# Patient Record
Sex: Female | Born: 1985 | Race: White | Hispanic: No | Marital: Married | Smoking: Never smoker
Health system: Southern US, Community
[De-identification: ages and names within clinical notes are randomized; demographics above are authoritative.]

## PROBLEM LIST (undated history)

## (undated) DIAGNOSIS — E669 Obesity, unspecified: Secondary | ICD-10-CM

## (undated) DIAGNOSIS — M549 Dorsalgia, unspecified: Secondary | ICD-10-CM

---

## 2005-04-28 HISTORY — PX: CHOLECYSTECTOMY: SHX55

## 2006-06-22 ENCOUNTER — Other Ambulatory Visit: Admission: RE | Admit: 2006-06-22 | Discharge: 2006-06-22 | Payer: Self-pay | Admitting: Obstetrics & Gynecology

## 2007-06-30 ENCOUNTER — Emergency Department (HOSPITAL_COMMUNITY): Admission: EM | Admit: 2007-06-30 | Discharge: 2007-06-30 | Payer: Self-pay | Admitting: Emergency Medicine

## 2008-06-16 ENCOUNTER — Ambulatory Visit (HOSPITAL_COMMUNITY): Admission: RE | Admit: 2008-06-16 | Discharge: 2008-06-16 | Payer: Self-pay | Admitting: Obstetrics and Gynecology

## 2008-09-29 ENCOUNTER — Inpatient Hospital Stay (HOSPITAL_COMMUNITY): Admission: AD | Admit: 2008-09-29 | Discharge: 2008-10-02 | Payer: Self-pay | Admitting: Obstetrics and Gynecology

## 2010-08-05 LAB — CBC
Hemoglobin: 11.6 g/dL — ABNORMAL LOW (ref 12.0–15.0)
Hemoglobin: 8.7 g/dL — ABNORMAL LOW (ref 12.0–15.0)
MCHC: 34.5 g/dL (ref 30.0–36.0)
RBC: 3.84 MIL/uL — ABNORMAL LOW (ref 3.87–5.11)
RDW: 14.1 % (ref 11.5–15.5)
RDW: 14.1 % (ref 11.5–15.5)

## 2010-08-05 LAB — TYPE AND SCREEN
ABO/RH(D): O POS
Antibody Screen: NEGATIVE

## 2010-08-05 LAB — APTT: aPTT: 30 seconds (ref 24–37)

## 2010-08-05 LAB — ABO/RH: ABO/RH(D): O POS

## 2010-08-05 LAB — PROTIME-INR: INR: 1 (ref 0.00–1.49)

## 2010-09-10 NOTE — H&P (Signed)
NAME:  Rebecca Lambert, BELLIN NO.:  [id number]   MEDICAL RECORD NO.:  [id number]          PATIENT TYPE:  INP   LOCATION:  NA                            FACILITY:  [facility]   PHYSICIAN:  Kendra H. Tenny Craw, MD     DATE OF BIRTH:  07-15-1985   DATE OF ADMISSION:  DATE OF DISCHARGE:                              HISTORY & PHYSICAL   CHIEF COMPLAINT:  A 39-week intrauterine pregnancy complete-complete  breech presentation.   HISTORY OF PRESENT ILLNESS:  Rebecca Lambert is a 25 year old gravida 1,  para 0 with an estimated date of confinement of October 03, 2008 by last  menstrual period of December 28, 2007 who presents on September 29, 2008 at 39  weeks and 4 days for a scheduled primary cesarean section for complete  breech presentation.  An ultrasound was performed at 37 weeks for size  greater than dates and demonstrated an estimated fetal weight of 2877 g,  which was 44th percentile.  However, at that time the fetus was noted to  be a complete breech presentation.  She was given the option of external  cephalic version versus scheduled primary C-section.  She declined  version and elected to go with primary C-section.  Risks, benefits, and  alternatives of the procedure were discussed with the patient.  She  presents today on September 29, 2008 for a scheduled primary low transverse  cesarean section.   PAST MEDICAL HISTORY:  1. Irritable bowel syndrome.  2. Gastroesophageal reflux disease.  3. Obesity, BMI 38   PAST SURGICAL HISTORY:  Cholecystectomy in 2007.   PAST OBSTETRICAL HISTORY:  G1, P0, current pregnancy.   PAST GYNECOLOGIC HISTORY:  No abnormal Pap smears or sexually  transmitted diseases.   SOCIAL HISTORY:  Negative x3.   MEDICATIONS:  Prenatal vitamins.   ALLERGIES:  SULFA causes a rash.   CURRENT PRENATAL LABORATORY DATA:  Blood type O+, antibody negative, RPR  nonreactive, rubella immune, hepatitis B surface antigen negative, and  HIV nonreactive.  First trimester  screen, no increased risk.  Second  trimester maternal serum AFP, no increased risk, 1-hour Glucola 111.  GBS negative.  Gonorrhea and Chlamydia were negative.  Pap smear was  within normal limits.  Urine culture was negative.  Cystic fibrosis  screen was negative.   PHYSICAL EXAMINATION:  The patient is 74 inches tall and weighs 339  pounds.  Blood pressure is 118/64.  She has 1+ edema. She is alert and  oriented x3.  Abdomen is gravid, soft, and nontender.  Cervix is  deferred, but previously had been noted to be fingertip thick and high.   ASSESSMENT AND PLAN:  This is a 25 year old G1, P0 at 39 weeks and 4  days who presents for a primary scheduled cesarean section for breech.      Freddrick March. Tenny Craw, MD  Electronically Signed     KHR/MEDQ  D:  09/26/2008  T:  09/27/2008  Job:  [id number]

## 2010-09-10 NOTE — Discharge Summary (Signed)
NAMEDEJANAE, Rebecca Lambert NO.:  [id number]   MEDICAL RECORD NO.:  [id number]          PATIENT TYPE:  INP   LOCATION:  9126                          FACILITY:  [facility]   PHYSICIAN:  Gerrit Friends. Aldona Bar, M.D.   DATE OF BIRTH:  11-05-1985   DATE OF ADMISSION:  09/29/2008  DATE OF DISCHARGE:  10/02/2008                               DISCHARGE SUMMARY   DISCHARGE DIAGNOSES:  1. A 39-week intrauterine pregnancy, delivered, 8 pounds 12 ounces      female infant, Apgars of 9 and 9.  2. Blood type O positive.  3. Double footling breech presentation.   PROCEDURE:  Primary low-transverse cesarean section.   SUMMARY:  This is a 25 year old female, a primigravida with a due date  of October 03, 2008, was taken to the operating room on September 29, 2008, by Dr.  Tenny Craw for a delivery by cesarean section with a known breech  presentation.   She was delivered of an 8-pound 12-ounce female infant - double footling  breech presentation - an Apgars of 9 and 9.   She had a benign postoperative course and on the morning of October 02, 2008, was ambulating without difficulty, tolerating a regular diet  without difficulty, having normal bowel and bladder function.  Her vital  signs were stable.  Her wound was clean and dry and she was desirous of  discharge.  Accordingly, she was discharged to home with appropriate  instructions.  Her staples were removed and wound was Steri-Stripped on  the morning of discharge.   DISCHARGE MEDICATIONS:  1. Motrin 600 mg every 6 hours as needed for cramping or pain.  2. Tylox 1-2 every 4-6 hours as needed for more severe pain.  3. She will return to the office for followup in approximately 4      weeks' time.  She was also instructed to continue on her vitamins 1      a day and add iron - 1 daily per instructions.   CONDITION ON DISCHARGE:  Improved.      Gerrit Friends. Aldona Bar, M.D.  Electronically Signed     RMW/MEDQ  D:  10/02/2008  T:  10/02/2008  Job:  [id number]

## 2010-09-10 NOTE — Op Note (Signed)
NAME:  [name], [name]:  [id number]   MEDICAL RECORD NO.:  [id number]          PATIENT TYPE:  INP   LOCATION:  9126                          FACILITY:  [facility]   PHYSICIAN:  Kendra H. Tenny Craw, MD     DATE OF BIRTH:  17-Nov-1985   DATE OF PROCEDURE:  09/29/2008  DATE OF DISCHARGE:                               OPERATIVE REPORT   PREOPERATIVE DIAGNOSES:  1. A 39 and 4-week intrauterine pregnancy.  2. Double footling breech presentation, declined external cephalic      version.  3. Maternal obesity with a BMI of 44.7.   POSTOPERATIVE DIAGNOSES:  1. A 39 and 4-week intrauterine pregnancy.  2. Double footling breech presentation, declined external cephalic      version.  3. Maternal obesity with a BMI of 44.7.   PROCEDURE:  Primary low transverse cesarean section via Pfannenstiel's  skin incision.   SURGEON:  Freddrick March. Tenny Craw, MD   ASSISTANT:  Miguel Aschoff, MD   ANESTHESIA:  Spinal.   OPERATIVE FINDINGS:  Vigorous female infant in the double footling breech  presentation weighing 8 pounds 12 ounces with Apgar scores of 9 at 1  minute and 9 at 5 minutes.  Normal-appearing ovaries and tubes,  questionable arcuate shape to the uterus.   SPECIMEN:  Placenta.  Disposition of specimen for disposal.   ESTIMATED BLOOD LOSS:  700 mL.   COMPLICATIONS:  Difficult placement of spinal anesthesia; otherwise,  procedure was without complication.   PROCEDURE IN DETAIL:  Rebecca Lambert is a 25 year old G1, P0, who  presents at 64 weeks and 4 days estimated gestational age with a fetus  in the double footling breech presentation.  The patient had an  ultrasound approximately 2 weeks ago for size greater than dates.  Estimated fetal weight was 44th percentile.  However, the infant was  noted to be in the double footling breech presentation.  The patient was  counseled regarding primary cesarean section versus external cephalic  version and she declined version, and wished to  proceed with primary  cesarean section.  Following the appropriate informed consent, the  patient was brought to the operating room where spinal anesthesia was  placed with some difficulty.  However, it was successfully placed and  found to be adequate.  She was placed in the dorsal supine position with  a leftward tilt, prepped and draped in the normal sterile fashion.  A  scalpel was then used to make a Pfannenstiel skin incision which was  carried down through the underlying layers of soft tissue to the fascia.  Fascia was incised in the midline.  Fascial incision was extended  laterally with Mayo scissors.  Superior aspect of the fascial incision  was grasped with Kocher clamps x2, tented up, underlying rectus muscle  was dissected sharply off of the electrocautery unit.  The same  procedure was repeated on the inferior aspect of the fascial incision.  Rectus muscles were then separated in the midline.  The abdominal  peritoneum was identified, tented up, entered sharply with the  Metzenbaum scissors and incision was extended  superiorly and inferiorly  with good visualization of the bladder with blunt dissection.  The  Alexis retractor was then placed intra-abdominally and deployed.  The  vesicouterine peritoneum was then identified, tented up, entered sharply  with the Metzenbaum.  The incision was extended laterally with the  Metzenbaum and the bladder flap was created with blunt dissection.  A  scalpel was then used to make a low transverse incision on the uterus  which was extended laterally with blunt dissection.  The bandage  scissors were used to extend the left aspect of the uterine incision.  The two feet were then identified and grasped and delivered through the  uterine incision followed by the body up to the level of the scapula  bilaterally.  The arms were swooped across the chest, the head was  flexed and delivered without difficulty.  The infant cried vigorously on  the  operative field.  After bulb suctioning, cord was clamped and cut.  Infant was then passed to the awaiting pediatricians.  Placenta was  spontaneously delivered.  Uterus was cleared of all clot and debris.  Uterine incision was repaired with #1 chromic in a running locked  fashion with a second imbricating layer.  The uterus was noted to be  somewhat arcuate in appearance.  The ovaries and tubes were normal.  The  uterus was then returned to the abdominal cavity.  Uterine incision was  hemostatic.  Uterine cavity was cleared of all clot and debris.  The  Alexis retractor was disassembled.  The abdominal peritoneum was  reapproximated with 2-0 Vicryl.  The rectus muscles were reapproximated  with 2-0 Vicryl.  The fascia was reapproximated with 0 looped PDS.  The  skin was reapproximated with 2-0 plain gut interrupted sutures.  The  skin was closed with staples.  All sponge, lap, and needle counts were  correct x2.  The patient tolerated the procedure well and was brought to  the recovery room in stable condition.      Freddrick March. Tenny Craw, MD  Electronically Signed     KHR/MEDQ  D:  09/29/2008  T:  09/30/2008  Job:  [id number]

## 2010-10-23 ENCOUNTER — Emergency Department (HOSPITAL_BASED_OUTPATIENT_CLINIC_OR_DEPARTMENT_OTHER)
Admission: EM | Admit: 2010-10-23 | Discharge: 2010-10-23 | Disposition: A | Discharge status: Home / Self Care | Payer: Managed Care, Other (non HMO) | Attending: Emergency Medicine | Admitting: Emergency Medicine

## 2010-10-23 DIAGNOSIS — M545 Low back pain, unspecified: Secondary | ICD-10-CM | POA: Insufficient documentation

## 2010-10-23 LAB — URINALYSIS, ROUTINE W REFLEX MICROSCOPIC
Glucose, UA: NEGATIVE mg/dL
Ketones, ur: NEGATIVE mg/dL
Leukocytes, UA: NEGATIVE
Protein, ur: NEGATIVE mg/dL

## 2014-09-24 ENCOUNTER — Encounter (HOSPITAL_BASED_OUTPATIENT_CLINIC_OR_DEPARTMENT_OTHER): Payer: Self-pay | Admitting: Emergency Medicine

## 2014-09-24 ENCOUNTER — Emergency Department (HOSPITAL_BASED_OUTPATIENT_CLINIC_OR_DEPARTMENT_OTHER)
Admission: EM | Admit: 2014-09-24 | Discharge: 2014-09-25 | Disposition: A | Discharge status: Home / Self Care | Payer: No Typology Code available for payment source | Attending: Emergency Medicine | Admitting: Emergency Medicine

## 2014-09-24 DIAGNOSIS — R110 Nausea: Secondary | ICD-10-CM | POA: Diagnosis not present

## 2014-09-24 DIAGNOSIS — Z9049 Acquired absence of other specified parts of digestive tract: Secondary | ICD-10-CM | POA: Insufficient documentation

## 2014-09-24 DIAGNOSIS — K5904 Chronic idiopathic constipation: Secondary | ICD-10-CM

## 2014-09-24 DIAGNOSIS — Z3202 Encounter for pregnancy test, result negative: Secondary | ICD-10-CM | POA: Insufficient documentation

## 2014-09-24 DIAGNOSIS — Z9889 Other specified postprocedural states: Secondary | ICD-10-CM | POA: Insufficient documentation

## 2014-09-24 DIAGNOSIS — R1084 Generalized abdominal pain: Secondary | ICD-10-CM | POA: Diagnosis present

## 2014-09-24 DIAGNOSIS — R109 Unspecified abdominal pain: Secondary | ICD-10-CM

## 2014-09-24 DIAGNOSIS — K5900 Constipation, unspecified: Secondary | ICD-10-CM | POA: Diagnosis not present

## 2014-09-24 MED ORDER — ONDANSETRON HCL 4 MG/2ML IJ SOLN
4.0000 mg | Freq: Once | INTRAMUSCULAR | Status: AC
  Administered 2014-09-25: 4 mg via INTRAVENOUS
  Filled 2014-09-24: qty 2

## 2014-09-24 NOTE — ED Notes (Signed)
Patient c/o generalized abd pain, onset Friday. Patient states she was on antibiotics for strep throat, penicillin & also orapred, stopped taking it on Friday due to GI abd discomfort. C/O nausea, denies vomiting, denies diarrhea, last BM 09/23/2014. Denies sick contact.

## 2014-09-25 ENCOUNTER — Encounter (HOSPITAL_BASED_OUTPATIENT_CLINIC_OR_DEPARTMENT_OTHER): Payer: Self-pay | Admitting: Emergency Medicine

## 2014-09-25 ENCOUNTER — Emergency Department (HOSPITAL_BASED_OUTPATIENT_CLINIC_OR_DEPARTMENT_OTHER): Payer: No Typology Code available for payment source

## 2014-09-25 LAB — CBC WITH DIFFERENTIAL/PLATELET
Basophils Absolute: 0 10*3/uL (ref 0.0–0.1)
Basophils Relative: 0 % (ref 0–1)
EOS ABS: 0.1 10*3/uL (ref 0.0–0.7)
EOS PCT: 1 % (ref 0–5)
HEMATOCRIT: 36.6 % (ref 36.0–46.0)
HEMOGLOBIN: 11.6 g/dL — AB (ref 12.0–15.0)
LYMPHS ABS: 3.4 10*3/uL (ref 0.7–4.0)
LYMPHS PCT: 29 % (ref 12–46)
MCH: 27.4 pg (ref 26.0–34.0)
MCHC: 31.7 g/dL (ref 30.0–36.0)
MCV: 86.3 fL (ref 78.0–100.0)
MONO ABS: 0.9 10*3/uL (ref 0.1–1.0)
Monocytes Relative: 8 % (ref 3–12)
NEUTROS PCT: 62 % (ref 43–77)
Neutro Abs: 7.3 10*3/uL (ref 1.7–7.7)
PLATELETS: 315 10*3/uL (ref 150–400)
RBC: 4.24 MIL/uL (ref 3.87–5.11)
RDW: 13.8 % (ref 11.5–15.5)
WBC: 11.8 10*3/uL — AB (ref 4.0–10.5)

## 2014-09-25 LAB — URINALYSIS, ROUTINE W REFLEX MICROSCOPIC
BILIRUBIN URINE: NEGATIVE
Glucose, UA: NEGATIVE mg/dL
HGB URINE DIPSTICK: NEGATIVE
Ketones, ur: NEGATIVE mg/dL
Leukocytes, UA: NEGATIVE
Nitrite: NEGATIVE
Protein, ur: NEGATIVE mg/dL
Specific Gravity, Urine: 1.01 (ref 1.005–1.030)
UROBILINOGEN UA: 0.2 mg/dL (ref 0.0–1.0)
pH: 6 (ref 5.0–8.0)

## 2014-09-25 LAB — COMPREHENSIVE METABOLIC PANEL
ALBUMIN: 3.6 g/dL (ref 3.5–5.0)
ALT: 26 U/L (ref 14–54)
ANION GAP: 8 (ref 5–15)
AST: 14 U/L — ABNORMAL LOW (ref 15–41)
Alkaline Phosphatase: 81 U/L (ref 38–126)
BUN: 13 mg/dL (ref 6–20)
CO2: 28 mmol/L (ref 22–32)
Calcium: 9.3 mg/dL (ref 8.9–10.3)
Chloride: 105 mmol/L (ref 101–111)
Creatinine, Ser: 0.73 mg/dL (ref 0.44–1.00)
GFR calc non Af Amer: >60 mL/min (ref >60)
Glucose, Bld: 107 mg/dL — ABNORMAL HIGH (ref 65–99)
POTASSIUM: 3.8 mmol/L (ref 3.5–5.1)
Sodium: 141 mmol/L (ref 135–145)
TOTAL PROTEIN: 7.6 g/dL (ref 6.5–8.1)
Total Bilirubin: 0.5 mg/dL (ref 0.3–1.2)

## 2014-09-25 LAB — PREGNANCY, URINE: PREG TEST UR: NEGATIVE

## 2014-09-25 LAB — LIPASE, BLOOD: LIPASE: 30 U/L (ref 22–51)

## 2014-09-25 MED ORDER — KETOROLAC TROMETHAMINE 30 MG/ML IJ SOLN
30.0000 mg | Freq: Once | INTRAMUSCULAR | Status: AC
  Administered 2014-09-25: 30 mg via INTRAVENOUS
  Filled 2014-09-25: qty 1

## 2014-09-25 MED ORDER — DOCUSATE SODIUM 100 MG PO CAPS: 100.0000 mg | ORAL_CAPSULE | Freq: Two times a day (BID) | ORAL | Status: AC

## 2014-09-25 MED ORDER — GI COCKTAIL ~~LOC~~
30.0000 mL | Freq: Once | ORAL | Status: AC
  Administered 2014-09-25: 30 mL via ORAL
  Filled 2014-09-25: qty 30

## 2014-09-25 MED ORDER — POLYETHYLENE GLYCOL 3350 17 GM/SCOOP PO POWD: 17.0000 g | Freq: Every day | ORAL | Status: AC

## 2014-09-25 MED ORDER — DICYCLOMINE HCL 10 MG/ML IM SOLN
20.0000 mg | Freq: Once | INTRAMUSCULAR | Status: AC
  Administered 2014-09-25: 20 mg via INTRAMUSCULAR
  Filled 2014-09-25: qty 2

## 2014-09-25 MED ORDER — CULTURELLE DIGESTIVE HEALTH PO CAPS: 1.0000 | ORAL_CAPSULE | Freq: Three times a day (TID) | ORAL | Status: AC

## 2014-09-25 MED ORDER — HYOSCYAMINE SULFATE 0.125 MG SL SUBL: 0.1250 mg | SUBLINGUAL_TABLET | SUBLINGUAL | Status: AC | PRN

## 2014-09-25 NOTE — ED Provider Notes (Signed)
CSN: [id number]     Arrival date & time 09/24/14  2323 History  This chart was scribed for Hampton Wixom, MD by Ronney LionSuzanne Le, ED Scribe. This patient was seen in room MH04/MH04 and the patient's care was started at 12:37 AM.    Chief Complaint  Patient presents with  . Abdominal Pain    generalized   Patient is a 29 y.o. female presenting with abdominal pain. The history is provided by the patient. No language interpreter was used.  Abdominal Pain Pain location:  Generalized Pain quality: squeezing   Pain radiates to:  Does not radiate Pain severity:  Moderate Onset quality:  Gradual Duration:  2 days Timing:  Constant Progression:  Unchanged Chronicity:  New Context: recent illness (strep)   Relieved by:  None tried Worsened by:  Nothing tried Ineffective treatments:  None tried Associated symptoms: constipation and nausea   Associated symptoms: no belching, no chest pain, no cough, no dysuria, no fever, no flatus and no vomiting   Risk factors: no alcohol abuse      HPI Comments: Rebecca Lambert is a 29 y.o. female who presents to the Emergency Department complaining of constant, generalized, "squeezing" abdominal pain that has been ongoing since 2-3 days ago. Patient reports 1 episode of diarrhea and nausea, and endorses feeling bloated. Her last normal BM was over a week ago. Patient was diagnosed with strep throat 1 week ago, and was given 5 days of penicillin and steroids orally, which she just finished. Nothing makes her symptoms better of worse. Patient endorses feeling bloated. She reports a history of cholecystectomy and C section in 2010.  She denies vomiting, dysuria, urine malodor, or any more belching or flatus than usual.    History reviewed. No pertinent past medical history. Past Surgical History  Procedure Laterality Date  . Cholecystectomy  2007  . Cesarean section  2010   No family history on file. History  Substance Use Topics  . Smoking status: Never Smoker    . Smokeless tobacco: Not on file  . Alcohol Use: No   OB History    No data available     Review of Systems  Constitutional: Negative for fever and appetite change.  Respiratory: Negative for cough.   Cardiovascular: Negative for chest pain.  Gastrointestinal: Positive for nausea, abdominal pain and constipation. Negative for vomiting and flatus.  Genitourinary: Negative for dysuria.  All other systems reviewed and are negative.   Allergies  Review of patient's allergies indicates no known allergies.  Home Medications   Prior to Admission medications   Not on File   BP 146/87 mmHg  Pulse 84  Temp(Src) 98.5 F (36.9 C) (Oral)  Resp 18  Ht 6' (1.829 m)  Wt 339 lb (153.769 kg)  BMI 45.97 kg/m2  SpO2 99%  LMP 09/15/2014 (Exact Date) Physical Exam  Constitutional: She is oriented to person, place, and time. She appears well-developed and well-nourished. No distress.  HENT:  Head: Normocephalic and atraumatic.  Mouth/Throat: Oropharynx is clear and moist. No oropharyngeal exudate.  Eyes: EOM are normal. Pupils are equal, round, and reactive to light.  Neck: Normal range of motion. Neck supple.  Cardiovascular: Normal rate, regular rhythm and normal heart sounds.   Pulmonary/Chest: Effort normal and breath sounds normal. No respiratory distress. She has no wheezes. She has no rales.  Abdominal: Soft. Bowel sounds are increased. There is no tenderness. There is no rigidity, no rebound, no guarding, no tenderness at McBurney's point and negative Murphy's  sign. No hernia.  Hyperactive bowel sounds on the left and right. Palpable stool  Musculoskeletal: Normal range of motion.  Neurological: She is alert and oriented to person, place, and time. She has normal reflexes. She displays normal reflexes. No cranial nerve deficit.  Skin: Skin is warm and dry.  Psychiatric: She has a normal mood and affect.  Nursing note and vitals reviewed.   ED Course  Procedures (including  critical care time)  DIAGNOSTIC STUDIES: Oxygen Saturation is 99% on RA, normal by my interpretation.    COORDINATION OF CARE: 12:40 AM - Discussed treatment plan with pt at bedside which includes XR, and pt agreed to plan.   Labs Review Labs Reviewed  CBC WITH DIFFERENTIAL/PLATELET - Abnormal; Notable for the following:    WBC 11.8 (*)    Hemoglobin 11.6 (*)    All other components within normal limits  PREGNANCY, URINE  URINALYSIS, ROUTINE W REFLEX MICROSCOPIC (NOT AT [facility])  COMPREHENSIVE METABOLIC PANEL  LIPASE, BLOOD    Imaging Review No results found.   EKG Interpretation None      MDM   Final diagnoses:  None   Exam and vitals are benign and reassuring.  White count 11 but she just complete a course of steroids and I suspect this is demargination. She states she had one episode of diarrhea a week ago and nothing since.   Feels like she needs to go but hasn't.  Will start patient on a bowel regimen and give strict return precautions.    I personally performed the services described in this documentation, which was scribed in my presence. The recorded information has been reviewed and is accurate.     Cy Blamer, MD 09/25/14 [phone]

## 2014-09-25 NOTE — ED Notes (Signed)
C/o diffuse abd pain, nausea and diarrhea, (denies: vomiting, lower abd pain, dysuria, bleeding, fever, back pain, sob, indigestion, dizziness, d/c or other sx), cholecystectomy in 2006, stopped abx early on day 5 of 7, last ate 1600 (tolerated), mentions watery yellow stool x1 today.

## 2017-01-22 IMAGING — DX DG ABDOMEN ACUTE W/ 1V CHEST
4 series · 4 of 4 positions shown · non-contrast
Comparison: Lumbar spine radiographs performed 06/30/2007

CLINICAL DATA: Acute onset of constant abdominal pain. Initial
encounter.

EXAM:
DG ABDOMEN ACUTE W/ 1V CHEST

[chest pa]
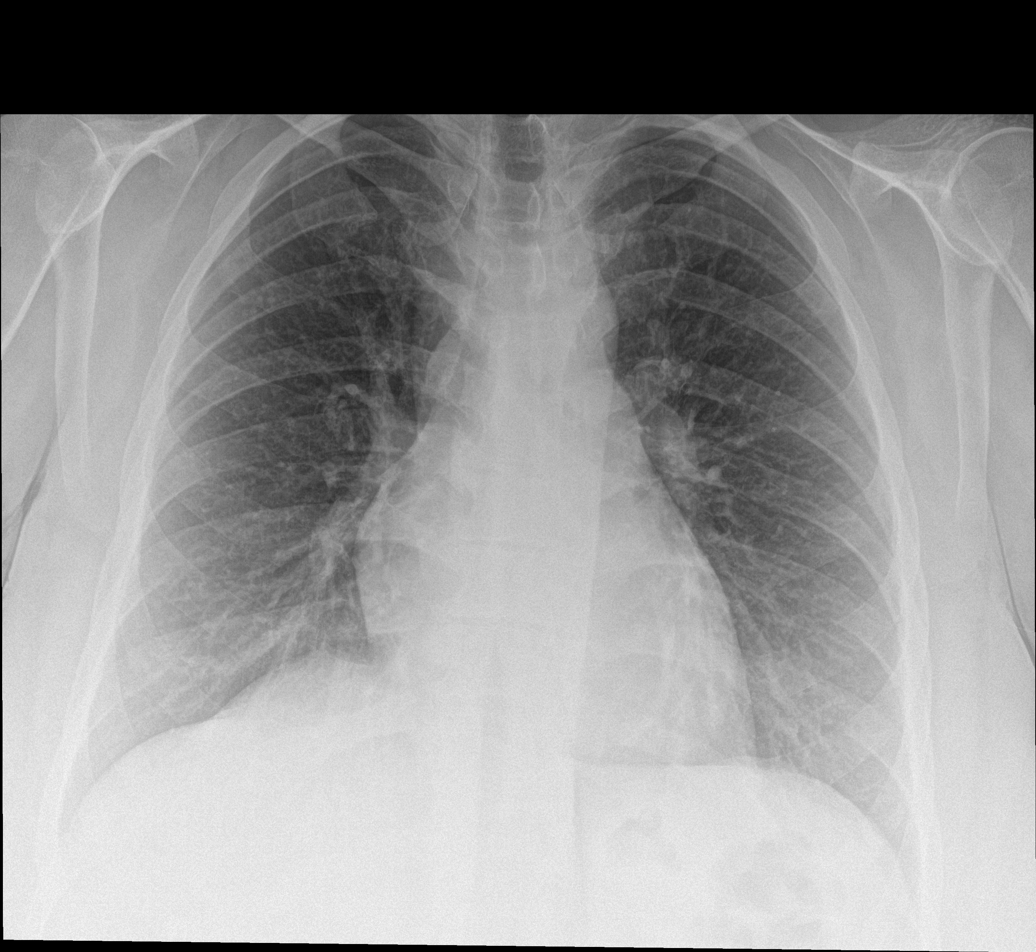

[abdomen erect]
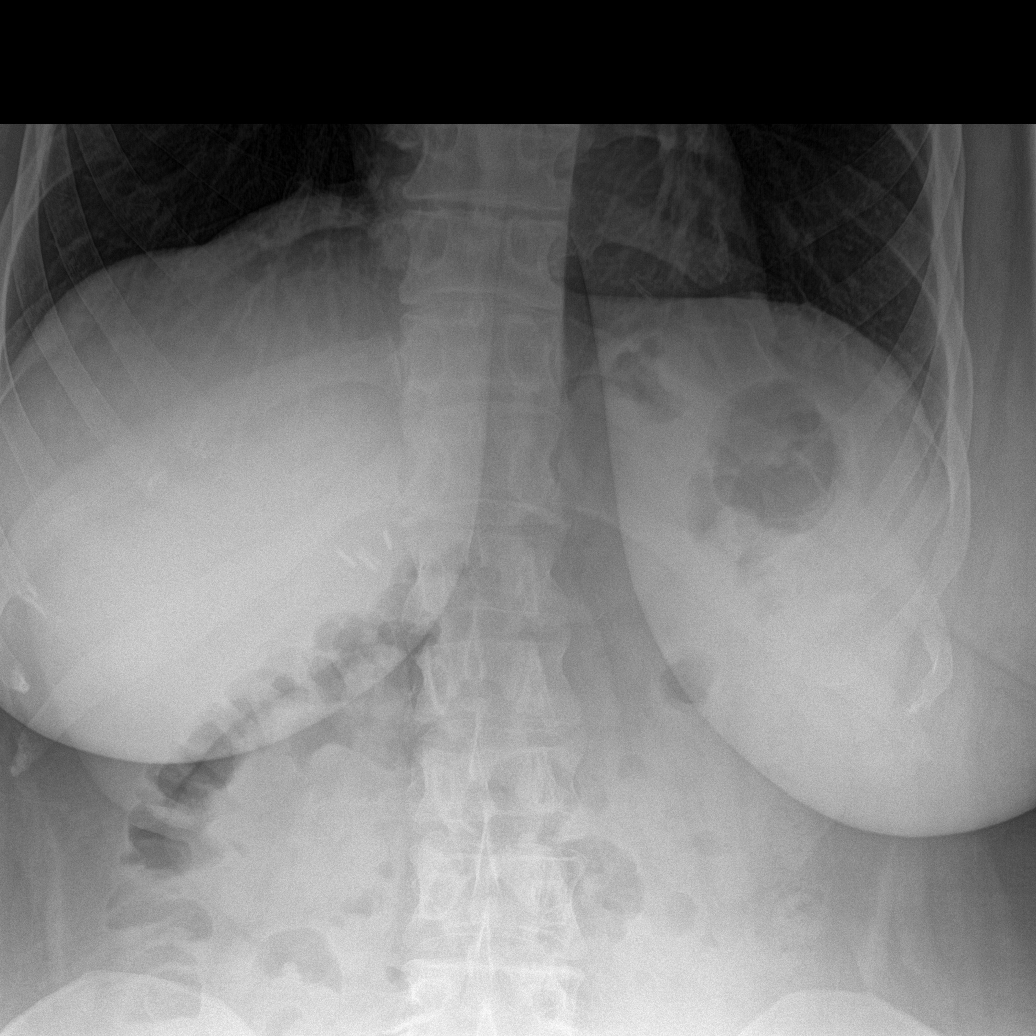

[abdomen supine (1 of 2)]
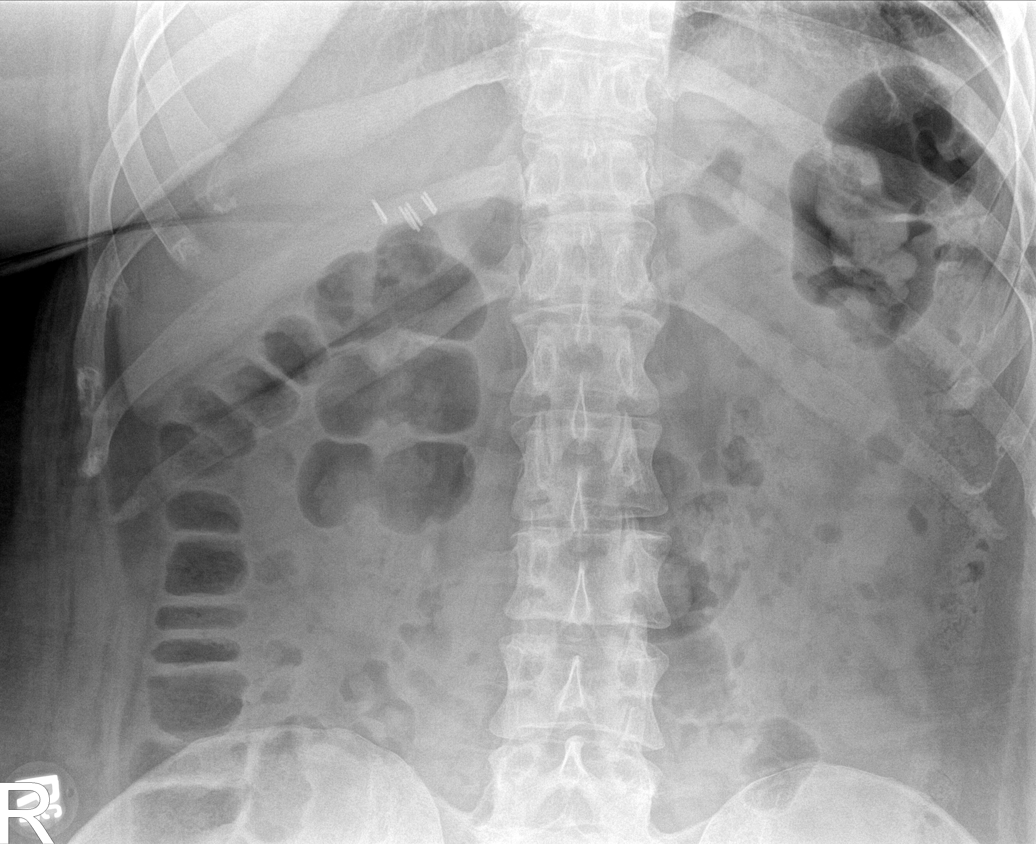

[abdomen supine (2 of 2)]
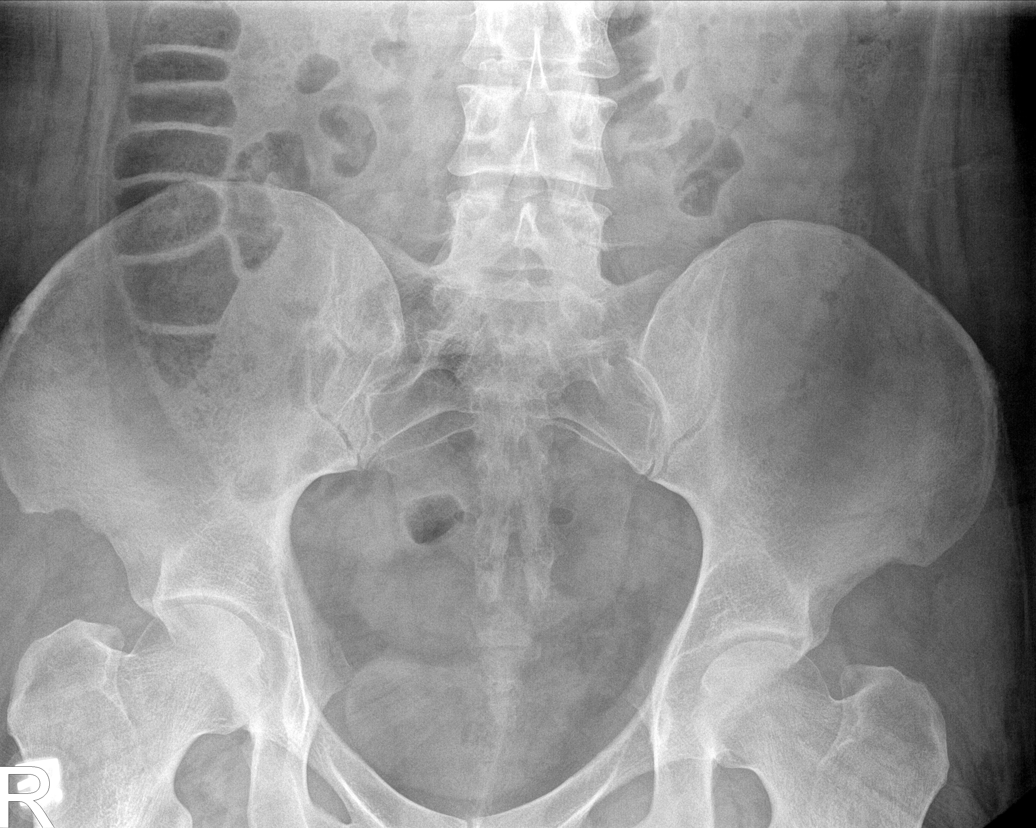

[4 of 4 positions shown; findings below may reference images not displayed]

FINDINGS: The lungs are well-aerated. Mild vascular congestion is noted. There
is no evidence of focal opacification, pleural effusion or
pneumothorax. The cardiomediastinal silhouette is within normal
limits.

The visualized bowel gas pattern is unremarkable. Scattered stool
and air are seen within the colon; there is no evidence of small
bowel dilatation to suggest obstruction. No free intra-abdominal air
is identified on the provided upright view. Clips are noted within
the right upper quadrant, reflecting prior cholecystectomy.

No acute osseous abnormalities are seen; the sacroiliac joints are
unremarkable in appearance.
IMPRESSION: 1. Unremarkable bowel gas pattern; no free intra-abdominal air seen.
Small to moderate amount of stool noted in the colon.
2. Mild vascular congestion; lungs remain grossly clear.

## 2017-05-15 ENCOUNTER — Emergency Department (HOSPITAL_BASED_OUTPATIENT_CLINIC_OR_DEPARTMENT_OTHER)
Admission: EM | Admit: 2017-05-15 | Discharge: 2017-05-15 | Disposition: A | Discharge status: Home / Self Care | Payer: No Typology Code available for payment source | Attending: Emergency Medicine | Admitting: Emergency Medicine

## 2017-05-15 ENCOUNTER — Other Ambulatory Visit: Payer: Self-pay

## 2017-05-15 ENCOUNTER — Encounter (HOSPITAL_BASED_OUTPATIENT_CLINIC_OR_DEPARTMENT_OTHER): Payer: Self-pay | Admitting: Emergency Medicine

## 2017-05-15 DIAGNOSIS — Z79899 Other long term (current) drug therapy: Secondary | ICD-10-CM | POA: Insufficient documentation

## 2017-05-15 DIAGNOSIS — M545 Low back pain: Secondary | ICD-10-CM | POA: Diagnosis present

## 2017-05-15 DIAGNOSIS — M5442 Lumbago with sciatica, left side: Secondary | ICD-10-CM | POA: Diagnosis not present

## 2017-05-15 HISTORY — DX: Dorsalgia, unspecified: M54.9

## 2017-05-15 HISTORY — DX: Obesity, unspecified: E66.9

## 2017-05-15 LAB — URINALYSIS, ROUTINE W REFLEX MICROSCOPIC
Bilirubin Urine: NEGATIVE
GLUCOSE, UA: NEGATIVE mg/dL
Ketones, ur: 15 mg/dL — AB
Leukocytes, UA: NEGATIVE
Nitrite: NEGATIVE
Protein, ur: NEGATIVE mg/dL
pH: 6 (ref 5.0–8.0)

## 2017-05-15 LAB — URINALYSIS, MICROSCOPIC (REFLEX)

## 2017-05-15 LAB — PREGNANCY, URINE: Preg Test, Ur: NEGATIVE

## 2017-05-15 MED ORDER — METHOCARBAMOL 500 MG PO TABS: 500.0000 mg | ORAL_TABLET | Freq: Three times a day (TID) | ORAL | 0 refills | Status: AC | PRN

## 2017-05-15 MED ORDER — HYDROCODONE-ACETAMINOPHEN 5-325 MG PO TABS
2.0000 | ORAL_TABLET | Freq: Once | ORAL | Status: AC
  Administered 2017-05-15: 2 via ORAL
  Filled 2017-05-15: qty 2

## 2017-05-15 MED ORDER — IBUPROFEN 600 MG PO TABS: 600.0000 mg | ORAL_TABLET | Freq: Three times a day (TID) | ORAL | 0 refills | Status: AC | PRN

## 2017-05-15 MED ORDER — CYCLOBENZAPRINE HCL 5 MG PO TABS
5.0000 mg | ORAL_TABLET | Freq: Once | ORAL | Status: AC
Start: 2017-05-15 — End: 2017-05-15
  Administered 2017-05-15: 5 mg via ORAL
  Filled 2017-05-15: qty 1

## 2017-05-15 MED ORDER — PREDNISONE 20 MG PO TABS: 40.0000 mg | ORAL_TABLET | Freq: Every day | ORAL | 0 refills | Status: AC

## 2017-05-15 MED ORDER — IBUPROFEN 400 MG PO TABS
600.0000 mg | ORAL_TABLET | Freq: Once | ORAL | Status: AC
  Administered 2017-05-15: 600 mg via ORAL
  Filled 2017-05-15: qty 1

## 2017-05-15 NOTE — ED Provider Notes (Signed)
[facility] EMERGENCY DEPARTMENT Provider Note   CSN: [id number] Arrival date & time: 05/15/17  0731     History   Chief Complaint Chief Complaint  Patient presents with  . Back Pain    HPI Rebecca Lambert is a 32 y.o. female.  HPI Patient is a 32 year old female presents the emergency department with left low back pain with radiation down her left buttock towards her left leg.  She had an episode like this before.  She states that her pain yesterday caused her to fall.  She denies weakness in her left lower extremity.  No recent injury or trauma otherwise prior to the development of her back pain.  No fevers or chills.  She has had episodes like this before.  She was told before it was a "muscle spasm".  Symptoms are moderate in severity.  No improvement with over-the-counter medications   Past Medical History:  Diagnosis Date  . Back pain   . Obesity     There are no active problems to display for this patient.   Past Surgical History:  Procedure Laterality Date  . CESAREAN SECTION  2010  . CHOLECYSTECTOMY  2007    OB History    No data available       Home Medications    Prior to Admission medications   Medication Sig Start Date End Date Taking? Authorizing Provider  docusate sodium (COLACE) 100 MG capsule Take 1 capsule (100 mg total) by mouth every 12 (twelve) hours. 09/25/14   Palumbo, April, MD  hyoscyamine (LEVSIN/SL) 0.125 MG SL tablet Place 1 tablet (0.125 mg total) under the tongue every 4 (four) hours as needed. 09/25/14   Palumbo, April, MD  ibuprofen (ADVIL,MOTRIN) 600 MG tablet Take 1 tablet (600 mg total) by mouth every 8 (eight) hours as needed. 05/15/17   Azalia Bilis, MD  Lactobacillus-Inulin (CULTURELLE DIGESTIVE HEALTH) CAPS Take 1 capsule by mouth 3 (three) times daily. 09/25/14   Palumbo, April, MD  methocarbamol (ROBAXIN) 500 MG tablet Take 1 tablet (500 mg total) by mouth every 8 (eight) hours as needed for muscle spasms. 05/15/17    Azalia Bilis, MD  polyethylene glycol powder (MIRALAX) powder Take 17 g by mouth daily. 09/25/14   Palumbo, April, MD  predniSONE (DELTASONE) 20 MG tablet Take 2 tablets (40 mg total) by mouth daily for 3 days. 05/15/17 05/18/17  Azalia Bilis, MD    Family History No family history on file.  Social History Social History   Tobacco Use  . Smoking status: Never Smoker  . Smokeless tobacco: Never Used  Substance Use Topics  . Alcohol use: No  . Drug use: No     Allergies   Patient has no known allergies.   Review of Systems Review of Systems  All other systems reviewed and are negative.    Physical Exam Updated Vital Signs BP 104/61 (BP Location: Right Arm)   Pulse 64   Temp 98.2 F (36.8 C) (Oral)   Resp 16   Ht 6\' 5"  (1.956 m)   Wt (!) 145.6 kg (321 lb)   LMP 05/01/2017 (Approximate)   SpO2 98%   BMI 38.07 kg/m   Physical Exam  Constitutional: She is oriented to person, place, and time. She appears well-developed and well-nourished. No distress.  HENT:  Head: Normocephalic and atraumatic.  Eyes: EOM are normal.  Neck: Normal range of motion.  Pulmonary/Chest: Effort normal.  Abdominal: Soft.  Musculoskeletal: Normal range of motion.  Full range of  motion bilateral hips, knees, ankles.  No lumbar or thoracic point tenderness.  Mild paralumbar tenderness on the left without overlying skin changes.  Tenderness into the left sciatic groove  Neurological: She is alert and oriented to person, place, and time.  Skin: Skin is warm and dry.  Psychiatric: She has a normal mood and affect. Judgment normal.  Nursing note and vitals reviewed.    ED Treatments / Results  Labs (all labs ordered are listed, but only abnormal results are displayed) Labs Reviewed  URINALYSIS, ROUTINE W REFLEX MICROSCOPIC - Abnormal; Notable for the following components:      Result Value   APPearance HAZY (*)    Specific Gravity, Urine >1.030 (*)    Hgb urine dipstick SMALL (*)     Ketones, ur 15 (*)    All other components within normal limits  URINALYSIS, MICROSCOPIC (REFLEX) - Abnormal; Notable for the following components:   Bacteria, UA MANY (*)    Squamous Epithelial / LPF 0-5 (*)    All other components within normal limits  URINE CULTURE  PREGNANCY, URINE    EKG  EKG Interpretation None       Radiology No results found.  Procedures Procedures (including critical care time)  Medications Ordered in ED Medications  HYDROcodone-acetaminophen (NORCO/VICODIN) 5-325 MG per tablet 2 tablet (2 tablets Oral Given 05/15/17 0820)  ibuprofen (ADVIL,MOTRIN) tablet 600 mg (600 mg Oral Given 05/15/17 0820)  cyclobenzaprine (FLEXERIL) tablet 5 mg (5 mg Oral Given 05/15/17 0820)     Initial Impression / Assessment and Plan / ED Course  I have reviewed the triage vital signs and the nursing notes.  Pertinent labs & imaging results that were available during my care of the patient were reviewed by me and considered in my medical decision making (see chart for details).     Normal lower extremity neurologic exam. No bowel or bladder complaints. No back pain red flags. Likely musculoskeletal back pain. Doubt spinal epidural abscess. Doubt cauda equina. Doubt abdominal aortic aneurysm.  Suspect sciatica.  Close primary care follow-up.  Patient understands return to the ER for new or worsening symptoms   Final Clinical Impressions(s) / ED Diagnoses   Final diagnoses:  Acute back pain with sciatica, left    ED Discharge Orders        Ordered    ibuprofen (ADVIL,MOTRIN) 600 MG tablet  Every 8 hours PRN     05/15/17 0906    methocarbamol (ROBAXIN) 500 MG tablet  Every 8 hours PRN     05/15/17 0906    predniSONE (DELTASONE) 20 MG tablet  Daily     05/15/17 0906       Azalia Bilisampos, Silus Lanzo, MD 05/15/17 [id number]

## 2017-05-15 NOTE — ED Triage Notes (Signed)
Pain in left low back and hip x2 days.  Sts it caused her to fall in the shower yesterday.  Sts she has had episodes of the same in the past and they called it "back spasms", but this time it is lasting longer. Pt is ambulatory without difficulty.

## 2017-05-16 LAB — URINE CULTURE: CULTURE: NO GROWTH
